# Patient Record
Sex: Male | Born: 1980 | Marital: Single | Smoking: Never smoker
Health system: Southern US, Community
[De-identification: ages and names within clinical notes are randomized; demographics above are authoritative.]

## PROBLEM LIST (undated history)

## (undated) DIAGNOSIS — R011 Cardiac murmur, unspecified: Secondary | ICD-10-CM

## (undated) HISTORY — PX: KNEE SURGERY: SHX244

## (undated) HISTORY — PX: ESOPHAGEAL DILATION: SHX303

---

## 2011-12-21 ENCOUNTER — Ambulatory Visit
Admission: RE | Admit: 2011-12-21 | Discharge: 2011-12-21 | Disposition: A | Discharge status: Home / Self Care | Payer: 59 | Source: Ambulatory Visit | Attending: Family Medicine | Admitting: Family Medicine

## 2011-12-21 ENCOUNTER — Other Ambulatory Visit: Payer: Self-pay | Admitting: Family Medicine

## 2016-04-27 ENCOUNTER — Emergency Department (HOSPITAL_BASED_OUTPATIENT_CLINIC_OR_DEPARTMENT_OTHER)
Admission: EM | Admit: 2016-04-27 | Discharge: 2016-04-27 | Disposition: A | Discharge status: Home / Self Care | Payer: Worker's Compensation | Attending: Emergency Medicine | Admitting: Emergency Medicine

## 2016-04-27 ENCOUNTER — Encounter (HOSPITAL_BASED_OUTPATIENT_CLINIC_OR_DEPARTMENT_OTHER): Payer: Self-pay | Admitting: Emergency Medicine

## 2016-04-27 ENCOUNTER — Emergency Department (HOSPITAL_BASED_OUTPATIENT_CLINIC_OR_DEPARTMENT_OTHER): Payer: Worker's Compensation

## 2016-04-27 DIAGNOSIS — Y939 Activity, unspecified: Secondary | ICD-10-CM | POA: Diagnosis not present

## 2016-04-27 DIAGNOSIS — Y999 Unspecified external cause status: Secondary | ICD-10-CM | POA: Insufficient documentation

## 2016-04-27 DIAGNOSIS — S6991XA Unspecified injury of right wrist, hand and finger(s), initial encounter: Secondary | ICD-10-CM | POA: Diagnosis present

## 2016-04-27 DIAGNOSIS — S62636A Displaced fracture of distal phalanx of right little finger, initial encounter for closed fracture: Secondary | ICD-10-CM | POA: Diagnosis not present

## 2016-04-27 DIAGNOSIS — W230XXA Caught, crushed, jammed, or pinched between moving objects, initial encounter: Secondary | ICD-10-CM | POA: Insufficient documentation

## 2016-04-27 DIAGNOSIS — S62622A Displaced fracture of medial phalanx of right middle finger, initial encounter for closed fracture: Secondary | ICD-10-CM | POA: Insufficient documentation

## 2016-04-27 DIAGNOSIS — S62666A Nondisplaced fracture of distal phalanx of right little finger, initial encounter for closed fracture: Secondary | ICD-10-CM

## 2016-04-27 DIAGNOSIS — Y929 Unspecified place or not applicable: Secondary | ICD-10-CM | POA: Diagnosis not present

## 2016-04-27 DIAGNOSIS — Z79899 Other long term (current) drug therapy: Secondary | ICD-10-CM | POA: Diagnosis not present

## 2016-04-27 MED ORDER — OXYCODONE-ACETAMINOPHEN 5-325 MG PO TABS: 1.0000 | ORAL_TABLET | Freq: Three times a day (TID) | ORAL | 0 refills | Status: DC | PRN

## 2016-04-27 MED ORDER — OXYCODONE-ACETAMINOPHEN 5-325 MG PO TABS
1.0000 | ORAL_TABLET | Freq: Once | ORAL | Status: AC
  Administered 2016-04-27: 1 via ORAL
  Filled 2016-04-27: qty 1

## 2016-04-27 NOTE — ED Triage Notes (Signed)
Patient reports that he had his hand caught in the rope of a door that he was trying to pull down on a truck. Reports that it was possibly even worse, as there was a deformity at first and he put it back into place

## 2016-04-27 NOTE — ED Provider Notes (Signed)
[facility] Provider Note   CSN: [id number] Arrival date & time: 04/27/16  2111  First Provider Contact:   First MD Initiated Contact with Patient 04/27/16 2231    By signing my name below, I, Soijett Blue, attest that this documentation has been prepared under the direction and in the presence of Fayrene Helper, PA-C Electronically Signed: Soijett Blue, ED Scribe. 04/27/16. 10:37 PM.   History   Chief Complaint Chief Complaint  Patient presents with  . Finger Injury    HPI Douglas Blair is a 35 y.o. male who presents to the Emergency Department complaining of right hand injury occurring 3 hours ago PTA. Pt notes that his right hand was cut in the rope on a door that he was attempting to close. Pt states that initially there was a significant deformity that he put back in place. Denies pain to the area. Pt is having associated symptoms of tingling to right middle finger, bruising to right middle finger and right ring finger. He notes that he has tried ice without medications for the relief of his symptoms. He denies color change, wound, rash, right wrist pain, right shoulder pain, numbness, and any other symptoms.   The history is provided by the patient. No language interpreter was used.    History reviewed. No pertinent past medical history.  There are no active problems to display for this patient.   Past Surgical History:  Procedure Laterality Date  . ESOPHAGEAL DILATION    . KNEE SURGERY         Home Medications    Prior to Admission medications   Medication Sig Start Date End Date Taking? Authorizing Provider  cetirizine (ZYRTEC) 10 MG tablet Take 10 mg by mouth daily.   Yes Historical Provider, MD    Family History History reviewed. No pertinent family history.  Social History Social History  Substance Use Topics  . Smoking status: Never Smoker  . Smokeless tobacco: Never Used  . Alcohol use No     Allergies   Review of patient's allergies  indicates no known allergies.   Review of Systems Review of Systems  Musculoskeletal: Positive for arthralgias (right middle and ring finger). Negative for joint swelling.  Skin: Positive for color change (bruising to right middle and ring finger). Negative for rash and wound.  Neurological: Negative for numbness.       Tingling to right middle finger.      Physical Exam Updated Vital Signs BP 124/81   Pulse 87   Temp 98 F (36.7 C) (Oral)   Resp 20   Ht  (1.803 m)   Wt 195 lb (88.5 kg)   SpO2 99%   BMI 27.20 kg/m   Physical Exam  Constitutional: He is oriented to person, place, and time. He appears well-developed and well-nourished. No distress.  HENT:  Head: Normocephalic and atraumatic.  Eyes: EOM are normal.  Neck: Neck supple.  Cardiovascular: Normal rate.   Pulmonary/Chest: Effort normal. No respiratory distress.  Abdominal: He exhibits no distension.  Musculoskeletal:       Right hand: He exhibits decreased range of motion (due to pain) and tenderness. He exhibits no deformity. Normal sensation noted.  Right hand with tenderness noted to 3rd and 4th finger on palpation with ecchymosis noted to volar aspect to both. Decreased ROM secondary to pain. Brisk cap refill with nl sensation throughout. No gross deformity.   Neurological: He is alert and oriented to person, place, and time.  Skin: Skin is  warm and dry.  Psychiatric: He has a normal mood and affect. His behavior is normal.  Nursing note and vitals reviewed.    ED Treatments / Results  DIAGNOSTIC STUDIES: Oxygen Saturation is 99% on RA, nl by my interpretation.    COORDINATION OF CARE: 10:35 PM Discussed treatment plan with pt at bedside which includes right hand xray, ice, consult to hand surgeon, and pt agreed to plan.   Radiology Dg Hand Complete Right  Result Date: 04/27/2016 CLINICAL DATA:  Crush injury in door today. Pain most prominent in the third and fourth digits. EXAM: RIGHT HAND -  COMPLETE 3+ VIEW COMPARISON:  None. FINDINGS: Oblique fracture of the third digit proximal phalanx with minimal displacement, no intra-articular extension. Oblique mildly comminuted fracture of the fourth digit middle phalanx is minimally displaced. No intra-articular extension. No additional fractures of the hand. The alignment is otherwise maintained. IMPRESSION: Oblique mildly displaced fractures of the third digit proximal phalanx and fourth digit middle phalanx. Electronically Signed   By: Rubye Oaks M.D.   On: 04/27/2016 21:40    Procedures Procedures (including critical care time)  Medications Ordered in ED Medications - No data to display   Initial Impression / Assessment and Plan / ED Course  I have reviewed the triage vital signs and the nursing notes.  Pertinent imaging results that were available during my care of the patient were reviewed by me and considered in my medical decision making (see chart for details).  Clinical Course    10:55 PM- Consult with hand surgeon, Dr. Melvyn Novas who recommends follow up in office tomorrow for further evaluation.    Final Clinical Impressions(s) / ED Diagnoses   Final diagnoses:  Closed displaced fracture of medial phalanx of right medial finger, initial encounter  Closed nondisplaced fracture of distal phalanx of right little finger, initial encounter    New Prescriptions New Prescriptions   OXYCODONE-ACETAMINOPHEN (PERCOCET) 5-325 MG TABLET    Take 1 tablet by mouth every 8 (eight) hours as needed for severe pain.   I personally performed the services described in this documentation, which was scribed in my presence. The recorded information has been reviewed and is accurate.   11:01 PM Pt had a mechanical injury and broke 2 fingers (3rd-4th) in his R dominant hand.  This is a closed injury.  He is NVI.  Finger splint applied.  Pain medication given.  I have consulted with hand specialist DR. Melvyn Novas who agrees to see pt in  office tomorrow.  Care discussed with Dr. Fayrene Fearing.     Fayrene Helper, PA-C 04/27/16 2302    Rolland Porter, MD 05/09/16 2233

## 2016-04-27 NOTE — ED Notes (Signed)
Ring and middle finger of rt hand caught in a rope when opening a door,  Increased swelling,  Pt states fingers were at 90 degree angle and he straightened them,  Ice applied

## 2016-04-27 NOTE — ED Notes (Signed)
PA at the bedside.

## 2016-04-27 NOTE — Discharge Instructions (Signed)
Please follow up at hand specialist Dr. Glenna Durand office tomorrow for further management of your broken fingers

## 2016-05-19 ENCOUNTER — Ambulatory Visit: Payer: Self-pay | Admitting: Allergy and Immunology

## 2016-06-23 ENCOUNTER — Ambulatory Visit: Payer: Self-pay | Admitting: Allergy and Immunology

## 2016-10-13 DIAGNOSIS — Z0000 Encounter for general adult medical examination without abnormal findings: Secondary | ICD-10-CM | POA: Diagnosis not present

## 2017-04-29 DIAGNOSIS — Z0000 Encounter for general adult medical examination without abnormal findings: Secondary | ICD-10-CM | POA: Diagnosis not present

## 2017-07-22 DIAGNOSIS — K219 Gastro-esophageal reflux disease without esophagitis: Secondary | ICD-10-CM | POA: Diagnosis not present

## 2018-01-28 DIAGNOSIS — M7661 Achilles tendinitis, right leg: Secondary | ICD-10-CM | POA: Diagnosis not present

## 2018-05-11 DIAGNOSIS — M7661 Achilles tendinitis, right leg: Secondary | ICD-10-CM | POA: Diagnosis not present

## 2018-05-11 DIAGNOSIS — M898X7 Other specified disorders of bone, ankle and foot: Secondary | ICD-10-CM | POA: Diagnosis not present

## 2018-05-11 DIAGNOSIS — M6701 Short Achilles tendon (acquired), right ankle: Secondary | ICD-10-CM | POA: Diagnosis not present

## 2018-05-18 DIAGNOSIS — Z0000 Encounter for general adult medical examination without abnormal findings: Secondary | ICD-10-CM | POA: Diagnosis not present

## 2018-05-18 DIAGNOSIS — Z13220 Encounter for screening for lipoid disorders: Secondary | ICD-10-CM | POA: Diagnosis not present

## 2018-05-19 DIAGNOSIS — M7661 Achilles tendinitis, right leg: Secondary | ICD-10-CM | POA: Diagnosis not present

## 2018-06-01 DIAGNOSIS — M898X7 Other specified disorders of bone, ankle and foot: Secondary | ICD-10-CM | POA: Diagnosis not present

## 2018-06-01 DIAGNOSIS — M7661 Achilles tendinitis, right leg: Secondary | ICD-10-CM | POA: Diagnosis not present

## 2018-06-01 DIAGNOSIS — M6701 Short Achilles tendon (acquired), right ankle: Secondary | ICD-10-CM | POA: Diagnosis not present

## 2018-06-08 ENCOUNTER — Other Ambulatory Visit (HOSPITAL_COMMUNITY): Payer: Self-pay | Admitting: Orthopedic Surgery

## 2018-06-23 ENCOUNTER — Encounter (HOSPITAL_BASED_OUTPATIENT_CLINIC_OR_DEPARTMENT_OTHER): Payer: Self-pay | Admitting: *Deleted

## 2018-06-23 ENCOUNTER — Other Ambulatory Visit: Payer: Self-pay

## 2018-06-29 DIAGNOSIS — H18603 Keratoconus, unspecified, bilateral: Secondary | ICD-10-CM | POA: Diagnosis not present

## 2018-06-30 ENCOUNTER — Ambulatory Visit (HOSPITAL_BASED_OUTPATIENT_CLINIC_OR_DEPARTMENT_OTHER): Payer: 59 | Admitting: Certified Registered"

## 2018-06-30 ENCOUNTER — Ambulatory Visit (HOSPITAL_BASED_OUTPATIENT_CLINIC_OR_DEPARTMENT_OTHER)
Admission: RE | Admit: 2018-06-30 | Discharge: 2018-06-30 | Disposition: A | Discharge status: Home / Self Care | Payer: 59 | Source: Ambulatory Visit | Attending: Orthopedic Surgery | Admitting: Orthopedic Surgery

## 2018-06-30 ENCOUNTER — Encounter (HOSPITAL_BASED_OUTPATIENT_CLINIC_OR_DEPARTMENT_OTHER)
Admission: RE | Disposition: A | Discharge status: Home / Self Care | Payer: Self-pay | Source: Ambulatory Visit | Attending: Orthopedic Surgery

## 2018-06-30 ENCOUNTER — Encounter (HOSPITAL_BASED_OUTPATIENT_CLINIC_OR_DEPARTMENT_OTHER): Payer: Self-pay

## 2018-06-30 ENCOUNTER — Other Ambulatory Visit: Payer: Self-pay

## 2018-06-30 DIAGNOSIS — M898X7 Other specified disorders of bone, ankle and foot: Secondary | ICD-10-CM | POA: Diagnosis not present

## 2018-06-30 DIAGNOSIS — R011 Cardiac murmur, unspecified: Secondary | ICD-10-CM | POA: Insufficient documentation

## 2018-06-30 DIAGNOSIS — M6701 Short Achilles tendon (acquired), right ankle: Secondary | ICD-10-CM | POA: Diagnosis not present

## 2018-06-30 DIAGNOSIS — K219 Gastro-esophageal reflux disease without esophagitis: Secondary | ICD-10-CM | POA: Diagnosis not present

## 2018-06-30 DIAGNOSIS — M9261 Juvenile osteochondrosis of tarsus, right ankle: Secondary | ICD-10-CM | POA: Diagnosis not present

## 2018-06-30 DIAGNOSIS — M7661 Achilles tendinitis, right leg: Secondary | ICD-10-CM | POA: Insufficient documentation

## 2018-06-30 DIAGNOSIS — G8918 Other acute postprocedural pain: Secondary | ICD-10-CM | POA: Diagnosis not present

## 2018-06-30 DIAGNOSIS — Z79899 Other long term (current) drug therapy: Secondary | ICD-10-CM | POA: Diagnosis not present

## 2018-06-30 HISTORY — PX: EXCISION HAGLUND'S DEFORMITY WITH ACHILLES TENDON REPAIR: SHX5627

## 2018-06-30 HISTORY — PX: GASTROCNEMIUS RECESSION: SHX863

## 2018-06-30 HISTORY — DX: Cardiac murmur, unspecified: R01.1

## 2018-06-30 SURGERY — RECESSION, MUSCLE, GASTROCNEMIUS
Anesthesia: General | Site: Ankle | Laterality: Right

## 2018-06-30 MED ORDER — SODIUM CHLORIDE 0.9 % IV SOLN: INTRAVENOUS | Status: DC

## 2018-06-30 MED ORDER — LIDOCAINE 2% (20 MG/ML) 5 ML SYRINGE
INTRAMUSCULAR | Status: AC
  Filled 2018-06-30: qty 5

## 2018-06-30 MED ORDER — CEFAZOLIN SODIUM-DEXTROSE 2-4 GM/100ML-% IV SOLN
2.0000 g | INTRAVENOUS | Status: AC
  Administered 2018-06-30: 2 g via INTRAVENOUS

## 2018-06-30 MED ORDER — OXYCODONE HCL 5 MG PO TABS: 5.0000 mg | ORAL_TABLET | ORAL | 0 refills | Status: AC | PRN

## 2018-06-30 MED ORDER — DEXAMETHASONE SODIUM PHOSPHATE 4 MG/ML IJ SOLN
INTRAMUSCULAR | Status: DC | PRN
  Administered 2018-06-30: 10 mg via INTRAVENOUS

## 2018-06-30 MED ORDER — FENTANYL CITRATE (PF) 100 MCG/2ML IJ SOLN
INTRAMUSCULAR | Status: AC
  Filled 2018-06-30: qty 2

## 2018-06-30 MED ORDER — ROPIVACAINE HCL 5 MG/ML IJ SOLN
INTRAMUSCULAR | Status: DC | PRN
  Administered 2018-06-30: 30 mL via PERINEURAL
  Administered 2018-06-30: 10 mL via PERINEURAL

## 2018-06-30 MED ORDER — LACTATED RINGERS IV SOLN
INTRAVENOUS | Status: DC
  Administered 2018-06-30 (×2): via INTRAVENOUS

## 2018-06-30 MED ORDER — OXYCODONE HCL 5 MG/5ML PO SOLN: 5.0000 mg | Freq: Once | ORAL | Status: AC | PRN

## 2018-06-30 MED ORDER — PROPOFOL 10 MG/ML IV BOLUS
INTRAVENOUS | Status: DC | PRN
  Administered 2018-06-30: 200 mg via INTRAVENOUS

## 2018-06-30 MED ORDER — OXYCODONE HCL 5 MG PO TABS
5.0000 mg | ORAL_TABLET | Freq: Once | ORAL | Status: AC | PRN
  Administered 2018-06-30: 5 mg via ORAL

## 2018-06-30 MED ORDER — ROCURONIUM BROMIDE 100 MG/10ML IV SOLN
INTRAVENOUS | Status: DC | PRN
  Administered 2018-06-30: 50 mg via INTRAVENOUS

## 2018-06-30 MED ORDER — CHLORHEXIDINE GLUCONATE 4 % EX LIQD: 60.0000 mL | Freq: Once | CUTANEOUS | Status: DC

## 2018-06-30 MED ORDER — PROPOFOL 500 MG/50ML IV EMUL
INTRAVENOUS | Status: AC
  Filled 2018-06-30: qty 50

## 2018-06-30 MED ORDER — SUGAMMADEX SODIUM 200 MG/2ML IV SOLN
INTRAVENOUS | Status: DC | PRN
  Administered 2018-06-30: 200 mg via INTRAVENOUS

## 2018-06-30 MED ORDER — ONDANSETRON HCL 4 MG/2ML IJ SOLN
INTRAMUSCULAR | Status: AC
  Filled 2018-06-30: qty 2

## 2018-06-30 MED ORDER — ONDANSETRON HCL 4 MG/2ML IJ SOLN
4.0000 mg | Freq: Once | INTRAMUSCULAR | Status: AC | PRN
  Administered 2018-06-30: 4 mg via INTRAVENOUS

## 2018-06-30 MED ORDER — LIDOCAINE HCL (CARDIAC) PF 100 MG/5ML IV SOSY
PREFILLED_SYRINGE | INTRAVENOUS | Status: DC | PRN
  Administered 2018-06-30: 100 mg via INTRAVENOUS

## 2018-06-30 MED ORDER — OXYCODONE HCL 5 MG PO TABS
ORAL_TABLET | ORAL | Status: AC
  Filled 2018-06-30: qty 1

## 2018-06-30 MED ORDER — MIDAZOLAM HCL 2 MG/2ML IJ SOLN
1.0000 mg | INTRAMUSCULAR | Status: DC | PRN
  Administered 2018-06-30: 2 mg via INTRAVENOUS

## 2018-06-30 MED ORDER — SENNA 8.6 MG PO TABS: 2.0000 | ORAL_TABLET | Freq: Two times a day (BID) | ORAL | 0 refills | Status: AC

## 2018-06-30 MED ORDER — CEFAZOLIN SODIUM-DEXTROSE 2-4 GM/100ML-% IV SOLN
INTRAVENOUS | Status: AC
  Filled 2018-06-30: qty 100

## 2018-06-30 MED ORDER — DOCUSATE SODIUM 100 MG PO CAPS: 100.0000 mg | ORAL_CAPSULE | Freq: Two times a day (BID) | ORAL | 0 refills | Status: AC

## 2018-06-30 MED ORDER — DEXAMETHASONE SODIUM PHOSPHATE 10 MG/ML IJ SOLN
INTRAMUSCULAR | Status: AC
  Filled 2018-06-30: qty 1

## 2018-06-30 MED ORDER — FENTANYL CITRATE (PF) 100 MCG/2ML IJ SOLN
50.0000 ug | INTRAMUSCULAR | Status: DC | PRN
  Administered 2018-06-30: 50 ug via INTRAVENOUS

## 2018-06-30 MED ORDER — SCOPOLAMINE 1 MG/3DAYS TD PT72: 1.0000 | MEDICATED_PATCH | Freq: Once | TRANSDERMAL | Status: DC | PRN

## 2018-06-30 MED ORDER — FENTANYL CITRATE (PF) 100 MCG/2ML IJ SOLN
25.0000 ug | INTRAMUSCULAR | Status: DC | PRN
  Administered 2018-06-30: 50 ug via INTRAVENOUS

## 2018-06-30 MED ORDER — MIDAZOLAM HCL 2 MG/2ML IJ SOLN
INTRAMUSCULAR | Status: AC
  Filled 2018-06-30: qty 2

## 2018-06-30 MED ORDER — ASPIRIN EC 81 MG PO TBEC: 81.0000 mg | DELAYED_RELEASE_TABLET | Freq: Two times a day (BID) | ORAL | 0 refills | Status: AC

## 2018-06-30 SURGICAL SUPPLY — 80 items
ANCHOR JUGGERKNOT WTAP NDL 2.9 (Anchor) ×4 IMPLANT
ANCHOR KNTLS VENTIX 4.75 (Anchor) ×4 IMPLANT
BANDAGE ACE 4X5 VEL STRL LF (GAUZE/BANDAGES/DRESSINGS) IMPLANT
BANDAGE ESMARK 6X9 LF (GAUZE/BANDAGES/DRESSINGS) ×1 IMPLANT
BIT DRILL JUGRKNT W/NDL BIT2.9 (DRILL) ×1 IMPLANT
BLADE AVERAGE 25X9 (BLADE) IMPLANT
BLADE MICRO SAGITTAL (BLADE) ×2 IMPLANT
BLADE SURG 15 STRL LF DISP TIS (BLADE) ×2 IMPLANT
BLADE SURG 15 STRL SS (BLADE) ×2
BNDG COHESIVE 4X5 TAN STRL (GAUZE/BANDAGES/DRESSINGS) ×2 IMPLANT
BNDG COHESIVE 6X5 TAN STRL LF (GAUZE/BANDAGES/DRESSINGS) ×2 IMPLANT
BNDG ESMARK 6X9 LF (GAUZE/BANDAGES/DRESSINGS) ×2
BOOT STEPPER DURA LG (SOFTGOODS) IMPLANT
BOOT STEPPER DURA MED (SOFTGOODS) IMPLANT
BOOT STEPPER DURA XLG (SOFTGOODS) IMPLANT
CANISTER SUCT 1200ML W/VALVE (MISCELLANEOUS) IMPLANT
CHLORAPREP W/TINT 26ML (MISCELLANEOUS) ×2 IMPLANT
COVER BACK TABLE 60X90IN (DRAPES) ×2 IMPLANT
CUFF TOURNIQUET SINGLE 34IN LL (TOURNIQUET CUFF) ×2 IMPLANT
CUFF TOURNIQUET SINGLE 44IN (TOURNIQUET CUFF) IMPLANT
DRAPE EXTREMITY T 121X128X90 (DRAPE) ×2 IMPLANT
DRAPE OEC MINIVIEW 54X84 (DRAPES) IMPLANT
DRAPE U-SHAPE 47X51 STRL (DRAPES) ×2 IMPLANT
DRILL JUGGERKNOT W/NDL BIT 2.9 (DRILL) ×2
DRSG MEPITEL 4X7.2 (GAUZE/BANDAGES/DRESSINGS) ×2 IMPLANT
DRSG PAD ABDOMINAL 8X10 ST (GAUZE/BANDAGES/DRESSINGS) ×4 IMPLANT
ELECT REM PT RETURN 9FT ADLT (ELECTROSURGICAL) ×2
ELECTRODE REM PT RTRN 9FT ADLT (ELECTROSURGICAL) ×1 IMPLANT
GAUZE SPONGE 4X4 12PLY STRL (GAUZE/BANDAGES/DRESSINGS) ×2 IMPLANT
GLOVE BIO SURGEON STRL SZ 6.5 (GLOVE) ×2 IMPLANT
GLOVE BIO SURGEON STRL SZ8 (GLOVE) ×2 IMPLANT
GLOVE BIOGEL PI IND STRL 8 (GLOVE) ×2 IMPLANT
GLOVE BIOGEL PI INDICATOR 8 (GLOVE) ×2
GLOVE ECLIPSE 8.0 STRL XLNG CF (GLOVE) ×2 IMPLANT
GOWN STRL REUS W/ TWL LRG LVL3 (GOWN DISPOSABLE) ×1 IMPLANT
GOWN STRL REUS W/ TWL XL LVL3 (GOWN DISPOSABLE) ×2 IMPLANT
GOWN STRL REUS W/TWL LRG LVL3 (GOWN DISPOSABLE) ×1
GOWN STRL REUS W/TWL XL LVL3 (GOWN DISPOSABLE) ×2
KIT BIO-TENODESIS 3X8 DISP (MISCELLANEOUS)
KIT INSRT BABSR STRL DISP BTN (MISCELLANEOUS) IMPLANT
NDL SAFETY ECLIPSE 18X1.5 (NEEDLE) IMPLANT
NDL SUT 6 .5 CRC .975X.05 MAYO (NEEDLE) IMPLANT
NEEDLE HYPO 18GX1.5 SHARP (NEEDLE)
NEEDLE HYPO 22GX1.5 SAFETY (NEEDLE) IMPLANT
NEEDLE HYPO 25X1 1.5 SAFETY (NEEDLE) IMPLANT
NEEDLE MAYO TAPER (NEEDLE)
NS IRRIG 1000ML POUR BTL (IV SOLUTION) ×2 IMPLANT
PACK BASIN DAY SURGERY FS (CUSTOM PROCEDURE TRAY) ×2 IMPLANT
PAD CAST 4YDX4 CTTN HI CHSV (CAST SUPPLIES) ×1 IMPLANT
PADDING CAST COTTON 4X4 STRL (CAST SUPPLIES) ×1
PADDING CAST COTTON 6X4 STRL (CAST SUPPLIES) ×2 IMPLANT
PENCIL BUTTON HOLSTER BLD 10FT (ELECTRODE) ×2 IMPLANT
SANITIZER HAND PURELL 535ML FO (MISCELLANEOUS) ×2 IMPLANT
SHEET MEDIUM DRAPE 40X70 STRL (DRAPES) ×2 IMPLANT
SLEEVE SCD COMPRESS KNEE MED (MISCELLANEOUS) ×2 IMPLANT
SPLINT FAST PLASTER 5X30 (CAST SUPPLIES) ×20
SPLINT PLASTER CAST FAST 5X30 (CAST SUPPLIES) ×20 IMPLANT
SPONGE LAP 18X18 RF (DISPOSABLE) ×2 IMPLANT
STAPLER VISISTAT 35W (STAPLE) IMPLANT
STOCKINETTE 6  STRL (DRAPES) ×1
STOCKINETTE 6 STRL (DRAPES) ×1 IMPLANT
SUCTION FRAZIER HANDLE 10FR (MISCELLANEOUS)
SUCTION TUBE FRAZIER 10FR DISP (MISCELLANEOUS) IMPLANT
SUT ETHIBOND 2 OS 4 DA (SUTURE) IMPLANT
SUT ETHIBOND 3-0 V-5 (SUTURE) IMPLANT
SUT ETHILON 3 0 PS 1 (SUTURE) ×2 IMPLANT
SUT FIBERWIRE #2 38 T-5 BLUE (SUTURE)
SUT MNCRL AB 3-0 PS2 18 (SUTURE) ×2 IMPLANT
SUT VIC AB 0 CT1 27 (SUTURE)
SUT VIC AB 0 CT1 27XBRD ANBCTR (SUTURE) IMPLANT
SUT VIC AB 0 SH 27 (SUTURE) IMPLANT
SUT VIC AB 2-0 SH 27 (SUTURE)
SUT VIC AB 2-0 SH 27XBRD (SUTURE) IMPLANT
SUTURE FIBERWR #2 38 T-5 BLUE (SUTURE) IMPLANT
SYR BULB 3OZ (MISCELLANEOUS) ×2 IMPLANT
SYR CONTROL 10ML LL (SYRINGE) IMPLANT
TOWEL GREEN STERILE FF (TOWEL DISPOSABLE) ×4 IMPLANT
TUBE CONNECTING 20X1/4 (TUBING) IMPLANT
UNDERPAD 30X30 (UNDERPADS AND DIAPERS) ×2 IMPLANT
YANKAUER SUCT BULB TIP NO VENT (SUCTIONS) IMPLANT

## 2018-06-30 NOTE — Anesthesia Preprocedure Evaluation (Addendum)
Anesthesia Evaluation  Patient identified by MRN, date of birth, ID band Patient awake    Reviewed: Allergy & Precautions, NPO status , Patient's Chart, lab work & pertinent test results  History of Anesthesia Complications Negative for: history of anesthetic complications  Airway Mallampati: II  TM Distance: >3 FB Neck ROM: Full    Dental no notable dental hx.    Pulmonary neg pulmonary ROS,    Pulmonary exam normal        Cardiovascular negative cardio ROS Normal cardiovascular exam     Neuro/Psych negative neurological ROS  negative psych ROS   GI/Hepatic Neg liver ROS, GERD  Controlled,  Endo/Other  negative endocrine ROS  Renal/GU negative Renal ROS  negative genitourinary   Musculoskeletal negative musculoskeletal ROS (+)   Abdominal   Peds  Hematology negative hematology ROS (+)   Anesthesia Other Findings   Reproductive/Obstetrics                            Anesthesia Physical Anesthesia Plan  ASA: I  Anesthesia Plan: General   Post-op Pain Management: GA combined w/ Regional for post-op pain   Induction: Intravenous  PONV Risk Score and Plan: 2 and Ondansetron, Dexamethasone and Treatment may vary due to age or medical condition  Airway Management Planned: Oral ETT  Additional Equipment: None  Intra-op Plan:   Post-operative Plan: Extubation in OR  Informed Consent: I have reviewed the patients History and Physical, chart, labs and discussed the procedure including the risks, benefits and alternatives for the proposed anesthesia with the patient or authorized representative who has indicated his/her understanding and acceptance.     Plan Discussed with:   Anesthesia Plan Comments:       Anesthesia Quick Evaluation

## 2018-06-30 NOTE — Anesthesia Procedure Notes (Signed)
Procedure Name: Intubation Date/Time: 06/30/2018 7:40 AM Performed by: Maryella Shivers, CRNA Pre-anesthesia Checklist: Patient identified, Emergency Drugs available, Suction available and Patient being monitored Patient Re-evaluated:Patient Re-evaluated prior to induction Oxygen Delivery Method: Circle system utilized Preoxygenation: Pre-oxygenation with 100% oxygen Induction Type: IV induction Ventilation: Mask ventilation without difficulty Laryngoscope Size: Mac and 4 Grade View: Grade I Tube type: Oral Tube size: 8.0 mm Number of attempts: 1 Airway Equipment and Method: Stylet and Oral airway Placement Confirmation: ETT inserted through vocal cords under direct vision,  positive ETCO2 and breath sounds checked- equal and bilateral Secured at: 22 cm Tube secured with: Tape Dental Injury: Teeth and Oropharynx as per pre-operative assessment

## 2018-06-30 NOTE — H&P (Signed)
Douglas Blair is an 38 y.o. male.   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  He has failed non op treatment and presents today for surgery.  Past Medical History:  Diagnosis Date  . Heart murmur     Past Surgical History:  Procedure Laterality Date  . ESOPHAGEAL DILATION    . KNEE SURGERY      History reviewed. No pertinent family history. Social History:  reports that he has never smoked. He has never used smokeless tobacco. He reports that he does not drink alcohol or use drugs.  Allergies: No Known Allergies  Medications Prior to Admission  Medication Sig Dispense Refill  . cetirizine (ZYRTEC) 10 MG tablet Take 10 mg by mouth daily.    . Multiple Vitamins-Minerals (MULTIVITAMIN WITH MINERALS) tablet Take 1 tablet by mouth daily.    Marland Kitchen omeprazole (PRILOSEC) 20 MG capsule Take 20 mg by mouth daily.      No results found for this or any previous visit (from the past 48 hour(s)). No results found.  ROS  No f/c/n/v/wt loss  Blood pressure 133/82, pulse (!) 57, temperature 98.1 F (36.7 C), temperature source Oral, resp. rate 16, height 5\' 11"  (1.803 m), weight 96.6 kg, SpO2 99 %. Physical Exam  wn wd male in nad.  A and o x 4.  Mood an daffect normal.  EOMI.  resp unlabored.  R heel TTP at the insertion of the achilles.  No lymphadenopathy.  5/5 strength in PF.  Heelcord is tight.  Sens to LT intact in the sural n dist.  Assessment/Plan R achilles insertional tendonopathy, short achilles and Haglund deformity - to OR for surgical treatment.  The risks and benefits of the alternative treatment options have been discussed in detail.  The patient wishes to proceed with surgery and specifically understands risks of bleeding, infection, nerve damage, blood clots, need for additional surgery, amputation and death.   Toni Arthurs, MD 07-Jul-2018, 7:19 AM

## 2018-06-30 NOTE — Progress Notes (Signed)
Assisted Dr. Witman with right, ultrasound guided, popliteal/saphenous block. Side rails up, monitors on throughout procedure. See vital signs in flow sheet. Tolerated Procedure well. 

## 2018-06-30 NOTE — Discharge Instructions (Addendum)
John Hewitt, MD °[facility] ° °Please read the following information regarding your care after surgery. ° °Medications  °You only need a prescription for the narcotic pain medicine (ex. oxycodone, Percocet, Norco).  All of the other medicines listed below are available over the counter. °X Aleve 2 pills twice a day for the first 3 days after surgery. °X acetominophen (Tylenol) 650 mg every 4-6 hours as you need for minor to moderate pain °X oxycodone as prescribed for severe pain ° °Narcotic pain medicine (ex. oxycodone, Percocet, Vicodin) will cause constipation.  To prevent this problem, take the following medicines while you are taking any pain medicine. °X docusate sodium (Colace) 100 mg twice a day X senna (Senokot) 2 tablets twice a day ° °X To help prevent blood clots, take a baby aspirin (81 mg) twice a day for two weeks after surgery.  You should also get up every hour while you are awake to move around.   ° °Weight Bearing °X Do not bear any weight on the operated leg or foot. ° °Cast / Splint / Dressing °X Keep your splint, cast or dressing clean and dry.  Don’t put anything (coat hanger, pencil, etc) down inside of it.  If it gets damp, use a hair dryer on the cool setting to dry it.  If it gets soaked, call the office to schedule an appointment for a cast change. ° ° °After your dressing, cast or splint is removed; you may shower, but do not soak or scrub the wound.  Allow the water to run over it, and then gently pat it dry. ° °Swelling °It is normal for you to have swelling where you had surgery.  To reduce swelling and pain, keep your toes above your nose for at least 3 days after surgery.  It may be necessary to keep your foot or leg elevated for several weeks.  If it hurts, it should be elevated. ° °Follow Up °Call my office at [phone] when you are discharged from the hospital or surgery center to schedule an appointment to be seen two weeks after surgery. ° °Call my office at  [phone] if you develop a fever >101.5° F, nausea, vomiting, bleeding from the surgical site or severe pain.   ° ° °Post Anesthesia Home Care Instructions ° °Activity: °Get plenty of rest for the remainder of the day. A responsible individual must stay with you for 24 hours following the procedure.  °For the next 24 hours, DO NOT: °-Drive a car °-Operate machinery °-Drink alcoholic beverages °-Take any medication unless instructed by your physician °-Make any legal decisions or sign important papers. ° °Meals: °Start with liquid foods such as gelatin or soup. Progress to regular foods as tolerated. Avoid greasy, spicy, heavy foods. If nausea and/or vomiting occur, drink only clear liquids until the nausea and/or vomiting subsides. Call your physician if vomiting continues. ° °Special Instructions/Symptoms: °Your throat may feel dry or sore from the anesthesia or the breathing tube placed in your throat during surgery. If this causes discomfort, gargle with warm salt water. The discomfort should disappear within 24 hours. ° °If you had a scopolamine patch placed behind your ear for the management of post- operative nausea and/or vomiting: ° °1. The medication in the patch is effective for 72 hours, after which it should be removed.  Wrap patch in a tissue and discard in the trash. Wash hands thoroughly with soap and water. °2. You may remove the patch earlier than 72 hours if you   experience unpleasant side effects which may include dry mouth, dizziness or visual disturbances. °3. Avoid touching the patch. Wash your hands with soap and water after contact with the patch. °  °Regional Anesthesia Blocks ° °1. Numbness or the inability to move the "blocked" extremity may last from 3-48 hours after placement. The length of time depends on the medication injected and your individual response to the medication. If the numbness is not going away after 48 hours, call your surgeon. ° °2. The extremity that is blocked will  need to be protected until the numbness is gone and the  Strength has returned. Because you cannot feel it, you will need to take extra care to avoid injury. Because it may be weak, you may have difficulty moving it or using it. You may not know what position it is in without looking at it while the block is in effect. ° °3. For blocks in the legs and feet, returning to weight bearing and walking needs to be done carefully. You will need to wait until the numbness is entirely gone and the strength has returned. You should be able to move your leg and foot normally before you try and bear weight or walk. You will need someone to be with you when you first try to ensure you do not fall and possibly risk injury. ° °4. Bruising and tenderness at the needle site are common side effects and will resolve in a few days. ° °5. Persistent numbness or new problems with movement should be communicated to the surgeon or the [facility] ([phone])/ [facility] ([phone]). °

## 2018-06-30 NOTE — Op Note (Signed)
06/30/2018  8:37 AM  PATIENT:  Douglas Blair  37 y.o. male  PRE-OPERATIVE DIAGNOSIS: 1.  Chronic right insertional Achilles tendinitis 2.  Short right Achilles tendon 3.  Right calcaneus Haglund deformity  POST-OPERATIVE DIAGNOSIS: Same  Procedure(s): 1.  Right gastrocnemius recession (separate incision) 2.  Excision of right calcaneus Haglund deformity 3.  Right Achilles debridement and reconstruction  SURGEON:  Toni Arthurs, MD  ASSISTANT: Alfredo Martinez, PA-C  ANESTHESIA:   General, regional  EBL:  minimal   TOURNIQUET: 46 minutes at 250 mmHg  COMPLICATIONS:  None apparent  DISPOSITION:  Extubated, awake and stable to recovery.  INDICATION FOR PROCEDURE: The patient is a 37 year old male with chronic right heel pain.  History, physical exam findings and MRI reveal chronic insertional tendinopathy and a Haglund deformity as well as a small enthesophyte.  He has failed nonoperative treatment to date including activity modification, oral anti-inflammatories, shoewear modification and physical therapy.  He presents now for operative treatment of this painful condition.  The risks and benefits of the alternative treatment options have been discussed in detail.  The patient wishes to proceed with surgery and specifically understands risks of bleeding, infection, nerve damage, blood clots, need for additional surgery, amputation and death.  PROCEDURE IN DETAIL: After preoperative consent was obtained and the correct operative site was identified, the patient was brought to the operating room supine on a stretcher.  Preoperative antibiotics were administered.  General anesthesia was administered.  A surgical timeout was taken.  The right lower extremity was exsanguinated and a thigh tourniquet inflated to 250 mmHg.  The patient was then turned into the prone position on the operating table with all bony prominences padded well.  The right lower externally was then prepped and draped in  standard sterile fashion.  A longitudinal incision was made over the calf.  Dissection was carried down through the subcutaneous tissues taking care to protect the sural nerve and lesser saphenous vein.  The gastric anemias tendon was then divided under direct vision.  The wound was irrigated and closed with Monocryl and nylon.  Attention was then turned to the posterior aspect of the heel.  A longitudinal incision was made and dissection was carried down to the skin, subtenons tissues and peritenon creating full-thickness flaps medially and laterally.  The Achilles tendon was then split longitudinally and released from its insertion on the calcaneus medially and laterally.  The tendon was sharply debrided of all degenerated tissue.  The Haglund deformity and insertional enthesophyte were then resected with the oscillating saw.  The cut surfaces of bone were smoothed with a rasp.  The wound was irrigated.  The Achilles tendon was then repaired back to the cut surface of bone using [employer] and [facility] medical anchors and an hourglass pattern of nonabsorbable suture.  The peritenon and deep subcutaneous tissues were then repaired over the tendon with inverted simple sutures of 2-0 Vicryl.  Skin incision was closed with running 3-0 nylon.  Sterile dressings were applied followed by a well-padded short leg splint.  The tourniquet was released after application of the dressings.  The patient was awakened from anesthesia and transported to the recovery room in stable condition.   FOLLOW UP PLAN: Nonweightbearing on the right lower extremity.  Follow-up in the office in 2 weeks for suture removal and conversion to a cam boot with 2 heel lifts.  Aspirin 81 mg p.o. twice daily for DVT prophylaxis.    Alfredo Martinez PA-C was present and scrubbed for the  duration of the operative case. His assistance was essential in positioning the patient, prepping and draping, gaining and maintaining exposure, performing the operation,  closing and dressing the wounds and applying the splint.

## 2018-06-30 NOTE — Transfer of Care (Signed)
Immediate Anesthesia Transfer of Care Note  Patient: Douglas Blair  Procedure(s) Performed: Right gastroc recession (Right Ankle) Achilles tendon debridement and reconstruction; excision of Haglund deformity (Right Ankle)  Patient Location: PACU  Anesthesia Type:GA combined with regional for post-op pain  Level of Consciousness: sedated  Airway & Oxygen Therapy: Patient Spontanous Breathing and Patient connected to face mask oxygen  Post-op Assessment: Report given to RN and Post -op Vital signs reviewed and stable  Post vital signs: Reviewed and stable  Last Vitals:  Vitals Value Taken Time  BP 108/54 06/30/2018  8:50 AM  Temp    Pulse 59 06/30/2018  8:52 AM  Resp 14 06/30/2018  8:52 AM  SpO2 100 % 06/30/2018  8:52 AM  Vitals shown include unvalidated device data.  Last Pain:  Vitals:   06/30/18 0637  TempSrc: Oral  PainSc: 5       Patients Stated Pain Goal: 2 (06/30/18 9528)  Complications: No apparent anesthesia complications

## 2018-06-30 NOTE — Anesthesia Procedure Notes (Signed)
Anesthesia Regional Block: Popliteal block   Pre-Anesthetic Checklist: ,, timeout performed, Correct Patient, Correct Site, Correct Laterality, Correct Procedure, Correct Position, site marked, Risks and benefits discussed,  Surgical consent,  Pre-op evaluation,  At surgeon's request and post-op pain management  Laterality: Right  Prep: chloraprep       Needles:  Injection technique: Single-shot  Needle Type: Echogenic Stimulator Needle     Needle Length: 9cm  Needle Gauge: 21     Additional Needles:   Procedures:,,,, ultrasound used (permanent image in chart),,,,  Narrative:  Start time: 06/30/2018 7:19 AM End time: 06/30/2018 7:25 AM Injection made incrementally with aspirations every 5 mL.  Performed by: Personally  Anesthesiologist: Lucretia Kern, MD  Additional Notes: Monitors applied. Injection made in 5cc increments. No resistance to injection. Good needle visualization. Patient tolerated procedure well.

## 2018-06-30 NOTE — Anesthesia Postprocedure Evaluation (Signed)
Anesthesia Post Note  Patient: Douglas Blair  Procedure(s) Performed: Right gastroc recession (Right Ankle) Achilles tendon debridement and reconstruction; excision of Haglund deformity (Right Ankle)     Patient location during evaluation: PACU Anesthesia Type: General Level of consciousness: awake and alert Pain management: pain level controlled Vital Signs Assessment: post-procedure vital signs reviewed and stable Respiratory status: spontaneous breathing, nonlabored ventilation, respiratory function stable and patient connected to nasal cannula oxygen Cardiovascular status: blood pressure returned to baseline and stable Postop Assessment: no apparent nausea or vomiting Anesthetic complications: no    Last Vitals:  Vitals:   06/30/18 1010 06/30/18 1015  BP:  120/86  Pulse: 63 65  Resp:  16  Temp:    SpO2: 99% 100%    Last Pain:  Vitals:   06/30/18 1015  TempSrc:   PainSc: 4                  Lucretia Kern

## 2018-06-30 NOTE — Anesthesia Procedure Notes (Signed)
Anesthesia Regional Block: Adductor canal block   Pre-Anesthetic Checklist: ,, timeout performed, Correct Patient, Correct Site, Correct Laterality, Correct Procedure, Correct Position, site marked, Risks and benefits discussed,  Surgical consent,  Pre-op evaluation,  At surgeon's request and post-op pain management  Laterality: Right  Prep: chloraprep       Needles:  Injection technique: Single-shot  Needle Type: Echogenic Stimulator Needle     Needle Length: 9cm  Needle Gauge: 21     Additional Needles:   Procedures:,,,, ultrasound used (permanent image in chart),,,,  Narrative:  Start time: 06/30/2018 7:25 AM End time: 06/30/2018 7:27 AM Injection made incrementally with aspirations every 5 mL.  Performed by: Personally  Anesthesiologist: Lucretia Kern, MD  Additional Notes: Monitors applied. Injection made in 5cc increments. No resistance to injection. Good needle visualization. Patient tolerated procedure well.

## 2018-07-01 ENCOUNTER — Encounter (HOSPITAL_BASED_OUTPATIENT_CLINIC_OR_DEPARTMENT_OTHER): Payer: Self-pay | Admitting: Orthopedic Surgery

## 2018-07-18 DIAGNOSIS — M7661 Achilles tendinitis, right leg: Secondary | ICD-10-CM | POA: Diagnosis not present

## 2018-08-19 DIAGNOSIS — M7661 Achilles tendinitis, right leg: Secondary | ICD-10-CM | POA: Diagnosis not present

## 2018-09-06 DIAGNOSIS — M7661 Achilles tendinitis, right leg: Secondary | ICD-10-CM | POA: Diagnosis not present

## 2018-09-13 DIAGNOSIS — M7661 Achilles tendinitis, right leg: Secondary | ICD-10-CM | POA: Diagnosis not present

## 2018-09-15 DIAGNOSIS — M7661 Achilles tendinitis, right leg: Secondary | ICD-10-CM | POA: Diagnosis not present

## 2018-09-19 DIAGNOSIS — M7661 Achilles tendinitis, right leg: Secondary | ICD-10-CM | POA: Diagnosis not present

## 2020-04-13 ENCOUNTER — Ambulatory Visit: Payer: Self-pay | Attending: Internal Medicine

## 2020-04-13 DIAGNOSIS — Z23 Encounter for immunization: Secondary | ICD-10-CM

## 2020-04-13 NOTE — Progress Notes (Signed)
   Covid-19 Vaccination Clinic  Name:  Douglas Blair    MRN: [id number] DOB: 20-Jun-1981  04/13/2020  [name] was observed post Covid-19 immunization for 15 minutes without incident. He was provided with Vaccine Information Sheet and instruction to access the V-Safe system.   [name] was instructed to call 911 with any severe reactions post vaccine: Marland Kitchen Difficulty breathing  . Swelling of face and throat  . A fast heartbeat  . A bad rash all over body  . Dizziness and weakness   Immunizations Administered    Name Date Dose VIS Date Route   Pfizer COVID-19 Vaccine 04/13/2020 11:38 AM 0.3 mL 11/22/2018 Intramuscular   Manufacturer: [employer], [employer]   Lot: [id number]   NDC: [address]-7

## 2020-05-07 ENCOUNTER — Ambulatory Visit: Payer: Self-pay

## 2020-05-11 ENCOUNTER — Ambulatory Visit: Payer: Self-pay

## 2020-05-14 ENCOUNTER — Ambulatory Visit: Payer: Self-pay

## 2020-05-21 ENCOUNTER — Ambulatory Visit: Payer: Self-pay | Attending: Internal Medicine

## 2020-05-21 DIAGNOSIS — Z23 Encounter for immunization: Secondary | ICD-10-CM

## 2020-05-21 NOTE — Progress Notes (Signed)
   Covid-19 Vaccination Clinic  Name:  Shykeem Resurreccion    MRN: [id number] DOB: Jun 19, 1981  05/21/2020  Mr. Tench was observed post Covid-19 immunization for 15 minutes without incident. He was provided with Vaccine Information Sheet and instruction to access the V-Safe system.   Mr. Giraldo was instructed to call 911 with any severe reactions post vaccine: Marland Kitchen Difficulty breathing  . Swelling of face and throat  . A fast heartbeat  . A bad rash all over body  . Dizziness and weakness   Immunizations Administered    Name Date Dose VIS Date Route   Pfizer COVID-19 Vaccine 05/21/2020 11:26 AM 0.3 mL 11/22/2018 Intramuscular   Manufacturer: [employer], [employer]   Lot: [id number]   NDC: [address]-2

## 2020-11-11 ENCOUNTER — Other Ambulatory Visit: Payer: Self-pay | Admitting: Orthopedic Surgery

## 2020-11-11 DIAGNOSIS — M25561 Pain in right knee: Secondary | ICD-10-CM

## 2020-11-18 ENCOUNTER — Ambulatory Visit
Admission: RE | Admit: 2020-11-18 | Discharge: 2020-11-18 | Disposition: A | Discharge status: Home / Self Care | Payer: Managed Care, Other (non HMO) | Source: Ambulatory Visit | Attending: Orthopedic Surgery | Admitting: Orthopedic Surgery

## 2020-11-18 DIAGNOSIS — M25561 Pain in right knee: Secondary | ICD-10-CM

## 2020-12-14 ENCOUNTER — Other Ambulatory Visit: Payer: Self-pay

## 2021-07-11 IMAGING — MR MR KNEE*R* W/O CM
4 of 6 series · 23 of 40 positions shown · non-contrast
Comparison: None.

CLINICAL DATA: Medial knee pain with locking

EXAM:
MRI OF THE RIGHT KNEE WITHOUT CONTRAST
TECHNIQUE: Multiplanar, multisequence MR imaging of the knee was performed. No
intravenous contrast was administered.

[Series 4: T1 · coronal · 4.0mm · 0.29mm/px · 3 of 29 slices shown]
[im 5/29]
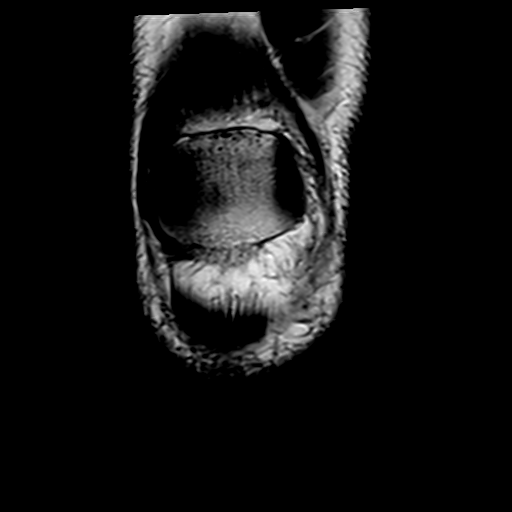
[im 15/29]
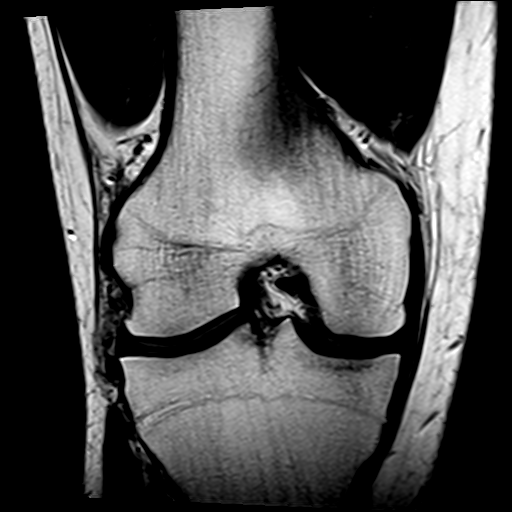
[im 24/29]
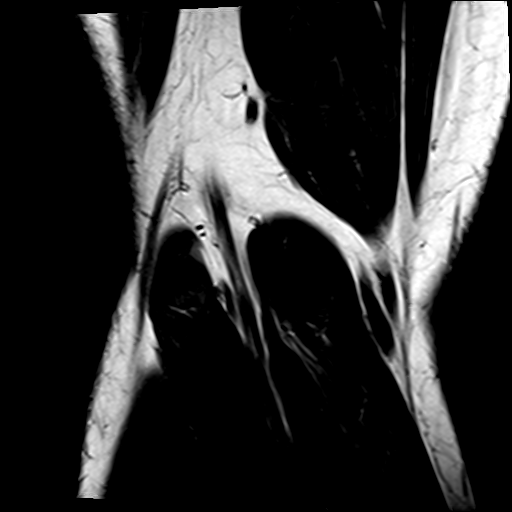

[Series 5: T2 fat-sat · coronal · 4.0mm · 0.59mm/px · 7 of 28 slices shown (1 of 2)]
[im 1/28]
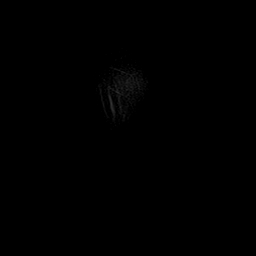
[im 5/28]
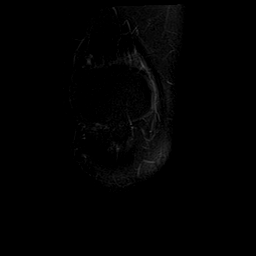
[im 10/28]
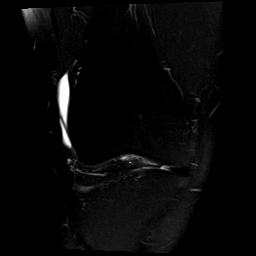
[im 14/28]
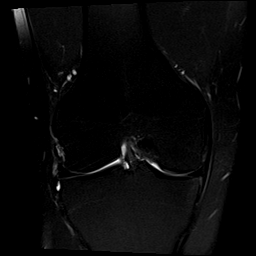
[im 19/28]
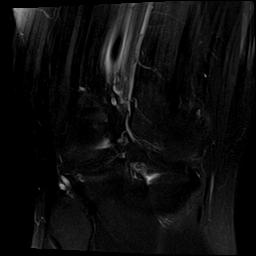
[im 23/28]
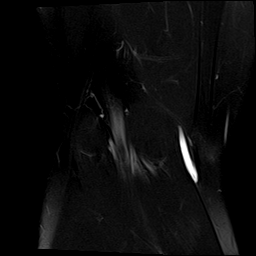
[im 28/28]
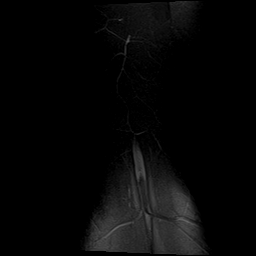

[Series 7: PD fat-sat · sagittal · 3.0mm · 0.29mm/px · 7 of 29 slices shown]
[im 1/29]
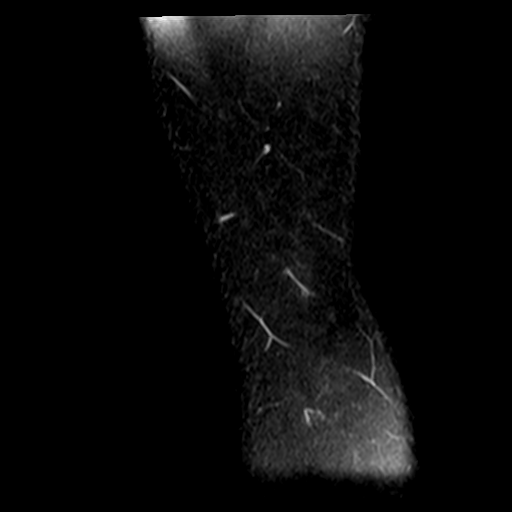
[im 5/29]
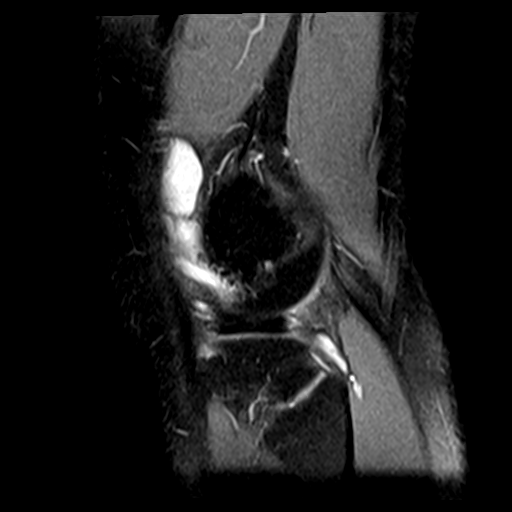
[im 10/29]
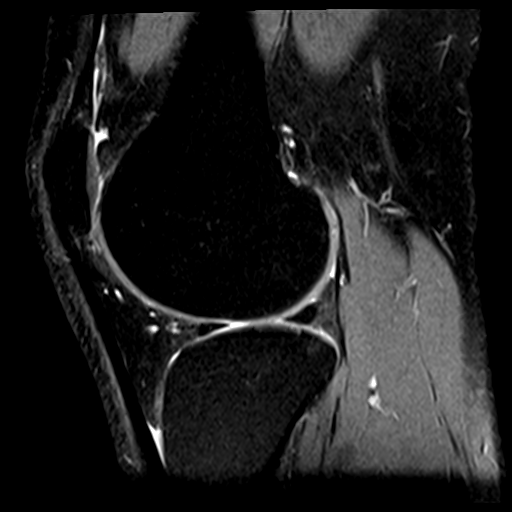
[im 15/29]
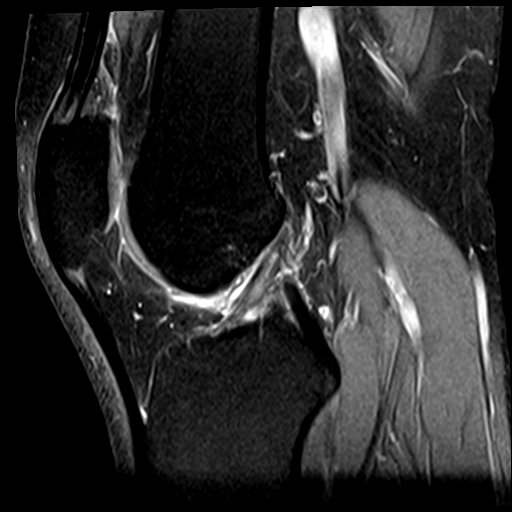
[im 19/29]
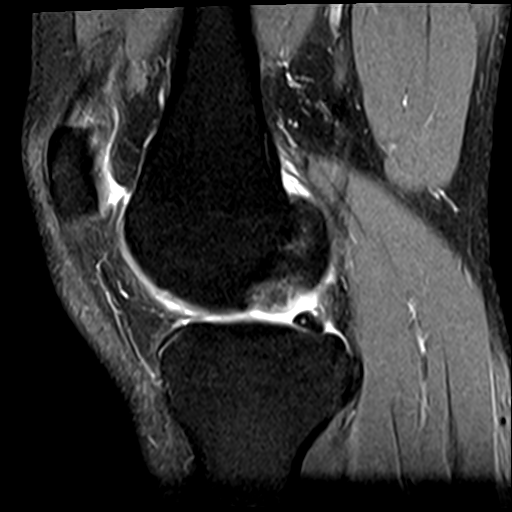
[im 24/29]
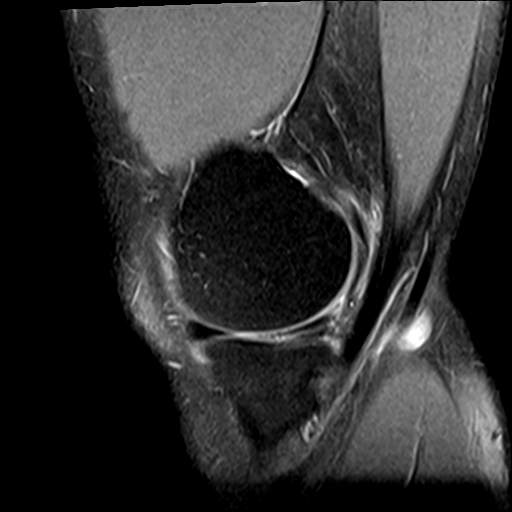
[im 29/29]
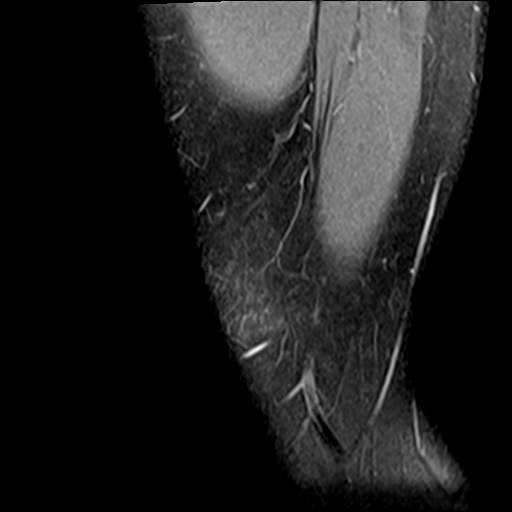

[Series 8: T2 fat-sat · sagittal · 3.0mm · 0.29mm/px · 6 of 29 slices shown (2 of 2)]
[im 1/29]
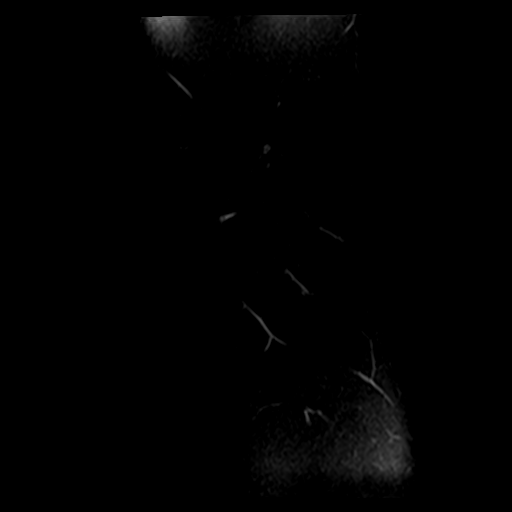
[im 5/29]
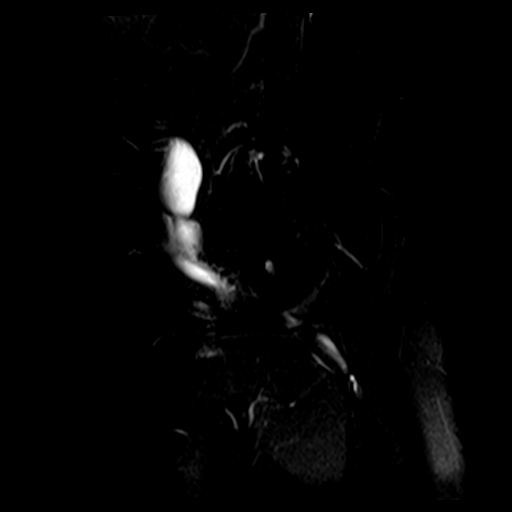
[im 10/29]
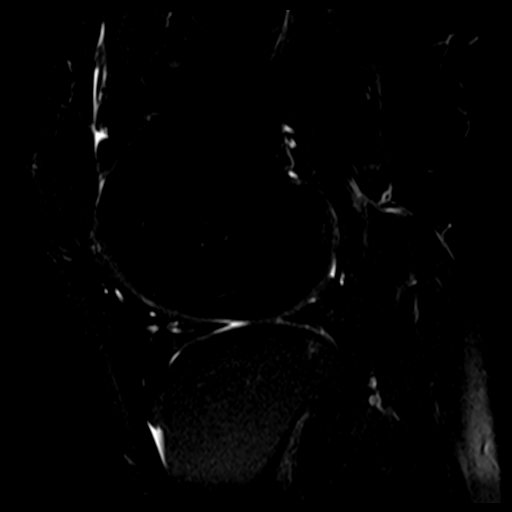
[im 15/29]
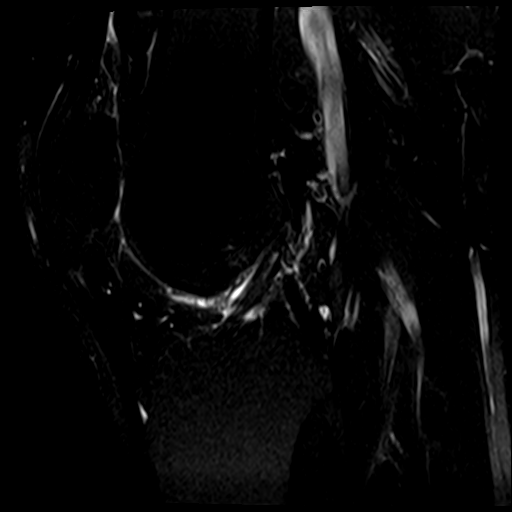
[im 19/29]
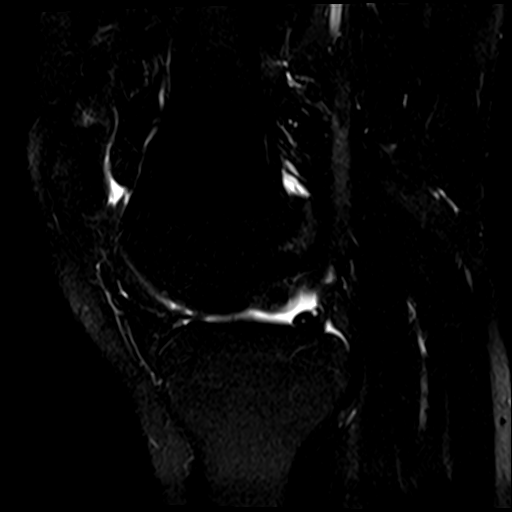
[im 24/29]
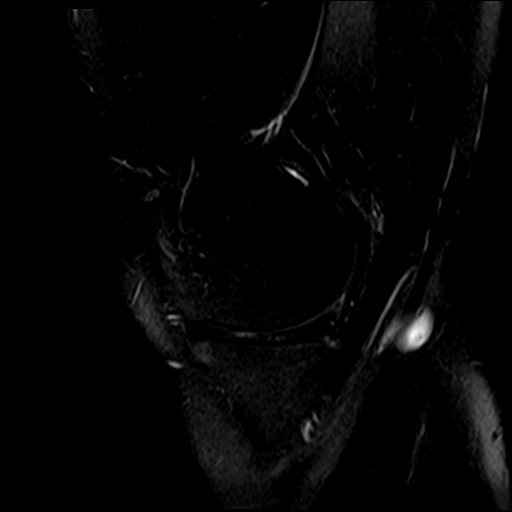

[23 of 40 positions shown; findings below may reference images not displayed]

FINDINGS: MENISCI

Medial: There is a complex horizontal longitudinal tear seen at the
posterior horn of the medial meniscus which extends to the mid body
and root attachment however the root attachment is still intact.
Tiny amount of fluid is seen extending posteriorly. No displaced
meniscal fragment.

Lateral: Intact.

LIGAMENTS

Cruciates: The ACL is intact. The PCL is intact.

Collaterals: Mildly increased signal seen within the superficial
fibers of the MCL, however it is intact. The lateral collateral
ligamentous complex is intact.

CARTILAGE

Patellofemoral: Normal.

Medial compartment: Mild chondral thinning seen within the
weight-bearing surface of the medial femoral condyle medial tibial
plateau.

Lateral compartment: Normal.

BONES: Mildly increased signal seen within the lateral tibial
plateau. No osseous fracture however is seen. No avascular
necrosis. No pathologic marrow infiltration.

JOINT: There is a trace knee joint effusion present. Normal Hypolito
Krushna Pradhan. No plical thickening.

EXTENSOR MECHANISM: The patellar and quadriceps tendon are intact.
The retinaculum is unremarkable.

POPLITEAL FOSSA: A 2 cm loculated popliteal cyst is present.

OTHER:  The visualized muscles are normal in appearance.
IMPRESSION: Complex tear of the posterior medial meniscus extending to the mid
body and detachment. There is slight extrusion of the mid body.

Grade 1 medial collateral ligamentous sprain of the superficial
fibers

Reactive marrow in the medial tibial plateau.  No osseous fracture

Mild medial compartment chondral disease

Loculated popliteal cyst without evidence of rupture.

## 2021-10-23 DIAGNOSIS — Z6832 Body mass index (BMI) 32.0-32.9, adult: Secondary | ICD-10-CM | POA: Diagnosis not present

## 2021-10-23 DIAGNOSIS — G43909 Migraine, unspecified, not intractable, without status migrainosus: Secondary | ICD-10-CM | POA: Diagnosis not present

## 2021-10-23 DIAGNOSIS — E785 Hyperlipidemia, unspecified: Secondary | ICD-10-CM | POA: Diagnosis not present

## 2021-10-23 DIAGNOSIS — K219 Gastro-esophageal reflux disease without esophagitis: Secondary | ICD-10-CM | POA: Diagnosis not present

## 2021-10-23 DIAGNOSIS — Z0000 Encounter for general adult medical examination without abnormal findings: Secondary | ICD-10-CM | POA: Diagnosis not present

## 2021-12-08 DIAGNOSIS — N401 Enlarged prostate with lower urinary tract symptoms: Secondary | ICD-10-CM | POA: Diagnosis not present

## 2021-12-08 DIAGNOSIS — R3912 Poor urinary stream: Secondary | ICD-10-CM | POA: Diagnosis not present

## 2021-12-08 DIAGNOSIS — E875 Hyperkalemia: Secondary | ICD-10-CM | POA: Diagnosis not present

## 2022-01-26 DIAGNOSIS — S299XXA Unspecified injury of thorax, initial encounter: Secondary | ICD-10-CM | POA: Diagnosis not present

## 2022-09-07 DIAGNOSIS — R69 Illness, unspecified: Secondary | ICD-10-CM | POA: Diagnosis not present

## 2022-09-07 DIAGNOSIS — Z6833 Body mass index (BMI) 33.0-33.9, adult: Secondary | ICD-10-CM | POA: Diagnosis not present

## 2022-09-07 DIAGNOSIS — Z0000 Encounter for general adult medical examination without abnormal findings: Secondary | ICD-10-CM | POA: Diagnosis not present

## 2022-09-07 DIAGNOSIS — Z13220 Encounter for screening for lipoid disorders: Secondary | ICD-10-CM | POA: Diagnosis not present

## 2022-09-07 DIAGNOSIS — R5383 Other fatigue: Secondary | ICD-10-CM | POA: Diagnosis not present

## 2022-09-23 DIAGNOSIS — Z20822 Contact with and (suspected) exposure to covid-19: Secondary | ICD-10-CM | POA: Diagnosis not present

## 2022-09-23 DIAGNOSIS — Z6832 Body mass index (BMI) 32.0-32.9, adult: Secondary | ICD-10-CM | POA: Diagnosis not present

## 2022-09-23 DIAGNOSIS — R051 Acute cough: Secondary | ICD-10-CM | POA: Diagnosis not present

## 2022-09-23 DIAGNOSIS — R52 Pain, unspecified: Secondary | ICD-10-CM | POA: Diagnosis not present

## 2022-09-23 DIAGNOSIS — R509 Fever, unspecified: Secondary | ICD-10-CM | POA: Diagnosis not present

## 2022-10-30 DIAGNOSIS — Z0000 Encounter for general adult medical examination without abnormal findings: Secondary | ICD-10-CM | POA: Diagnosis not present

## 2022-10-30 DIAGNOSIS — E782 Mixed hyperlipidemia: Secondary | ICD-10-CM | POA: Diagnosis not present

## 2022-10-30 DIAGNOSIS — R3911 Hesitancy of micturition: Secondary | ICD-10-CM | POA: Diagnosis not present

## 2022-10-30 DIAGNOSIS — Z6833 Body mass index (BMI) 33.0-33.9, adult: Secondary | ICD-10-CM | POA: Diagnosis not present

## 2022-10-30 DIAGNOSIS — E559 Vitamin D deficiency, unspecified: Secondary | ICD-10-CM | POA: Diagnosis not present

## 2022-10-30 DIAGNOSIS — G43009 Migraine without aura, not intractable, without status migrainosus: Secondary | ICD-10-CM | POA: Diagnosis not present

## 2022-10-30 DIAGNOSIS — R69 Illness, unspecified: Secondary | ICD-10-CM | POA: Diagnosis not present

## 2022-10-30 DIAGNOSIS — R748 Abnormal levels of other serum enzymes: Secondary | ICD-10-CM | POA: Diagnosis not present

## 2023-06-15 DIAGNOSIS — R1032 Left lower quadrant pain: Secondary | ICD-10-CM | POA: Diagnosis not present

## 2023-06-15 DIAGNOSIS — R1031 Right lower quadrant pain: Secondary | ICD-10-CM | POA: Diagnosis not present

## 2023-08-24 DIAGNOSIS — B360 Pityriasis versicolor: Secondary | ICD-10-CM | POA: Diagnosis not present
